# Patient Record
Sex: Male | Born: 1985 | Race: White | Hispanic: No | Marital: Married | Smoking: Never smoker
Health system: Southern US, Community
[De-identification: ages and names within clinical notes are randomized; demographics above are authoritative.]

---

## 2010-08-03 ENCOUNTER — Emergency Department (HOSPITAL_COMMUNITY): Admission: EM | Admit: 2010-08-03 | Discharge: 2010-08-03 | Payer: Self-pay | Admitting: Emergency Medicine

## 2010-12-17 LAB — POCT I-STAT, CHEM 8
Calcium, Ion: 1.14 mmol/L (ref 1.12–1.32)
Chloride: 106 mEq/L (ref 96–112)
Creatinine, Ser: 0.9 mg/dL (ref 0.4–1.5)
Glucose, Bld: 84 mg/dL (ref 70–99)
HCT: 43 % (ref 39.0–52.0)

## 2011-06-18 ENCOUNTER — Other Ambulatory Visit: Payer: Self-pay

## 2011-06-18 ENCOUNTER — Emergency Department (HOSPITAL_BASED_OUTPATIENT_CLINIC_OR_DEPARTMENT_OTHER)
Admission: EM | Admit: 2011-06-18 | Discharge: 2011-06-18 | Disposition: A | Discharge status: Home / Self Care | Payer: PRIVATE HEALTH INSURANCE | Attending: Emergency Medicine | Admitting: Emergency Medicine

## 2011-06-18 ENCOUNTER — Encounter: Payer: Self-pay | Admitting: *Deleted

## 2011-06-18 DIAGNOSIS — J45909 Unspecified asthma, uncomplicated: Secondary | ICD-10-CM | POA: Insufficient documentation

## 2011-06-18 DIAGNOSIS — L299 Pruritus, unspecified: Secondary | ICD-10-CM | POA: Insufficient documentation

## 2011-06-18 DIAGNOSIS — T7840XA Allergy, unspecified, initial encounter: Secondary | ICD-10-CM

## 2011-06-18 MED ORDER — EPINEPHRINE 0.3 MG/0.3ML IJ DEVI: 0.3000 mg | INTRAMUSCULAR | Status: AC | PRN

## 2011-06-18 MED ORDER — EPINEPHRINE HCL 1 MG/ML IJ SOLN
INTRAMUSCULAR | Status: AC
  Administered 2011-06-18: 0.3 mg via INTRAMUSCULAR
  Filled 2011-06-18: qty 1

## 2011-06-18 MED ORDER — DIPHENHYDRAMINE HCL 50 MG/ML IJ SOLN
INTRAMUSCULAR | Status: AC
  Administered 2011-06-18: 50 mg via INTRAVENOUS
  Filled 2011-06-18: qty 1

## 2011-06-18 NOTE — ED Notes (Signed)
Pt receives allergy injections for trees and grass nurse at work administers them has had them numerous times in the past pt had allergic reaction this time within 10 minutes of injection was wheezing and itching feeling as if his throat was going to close nurse gave 25 mg benadryl po called ems they placed iv administered 50 mg of benadryl , 50 mg of zantac iv and .3 of epi IM due to swelling of the throat. Pt awake and alert oriented with no complaints at present states he is feeling better

## 2011-06-18 NOTE — ED Provider Notes (Addendum)
History     CSN: [id number] Arrival date & time: 06/18/2011 12:02 PM   Chief Complaint  Patient presents with  . Allergic Reaction     (Include location/radiation/quality/duration/timing/severity/associated sxs/prior treatment) Patient is a 25 y.o. male presenting with allergic reaction. The history is provided by the patient.  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 His nurse did give 25 mg of Benadryl and called EMS IV was established and was given 50 mg of Benadryl 50 mg Zantac and 0.3 mg of epi IM due to the swelling patient is awake alert and oriented with no complaints at this time and is feeling better. Patient's nurse did come in to check on him she also states that he appears to be much better patient is sleepy from the Benadryl however at this point in time he denies any swelling of the tongue or lips or throat denies any difficulty breathing   Past Medical History  Diagnosis Date  . Asthma      History reviewed. No pertinent past surgical history.  History reviewed. No pertinent family history.  History  Substance Use Topics  . Smoking status: Never Smoker   . Smokeless tobacco: Never Used  . Alcohol Use: Yes     occ      Review of Systems  All other systems reviewed and are negative.    Allergies  Review of patient's allergies indicates no known allergies.  Home Medications   Current Outpatient Rx  Name Route Sig Dispense Refill  . ALBUTEROL SULFATE (2.5 MG/3ML) 0.083% IN NEBU Nebulization Take 2.5 mg by nebulization every 6 (six) hours as needed.        Physical Exam    BP 133/70  Pulse 71  Temp(Src) 97.9 F (36.6 C) (Oral)  Resp 20  SpO2 100%  Physical Exam  Constitutional: He is oriented to person, place, and time. He appears well-developed and well-nourished.  HENT:  Head:  Normocephalic and atraumatic.       Airway is patent. Secretions are normal. No swelling of the, lips or the posterior oral pharynx  Eyes: Conjunctivae and EOM are normal. Pupils are equal, round, and reactive to light.  Neck: Neck supple.  Cardiovascular: Normal rate and regular rhythm.  Exam reveals no gallop and no friction rub.   No murmur heard. Pulmonary/Chest: Breath sounds normal. He has no wheezes. He has no rales. He exhibits no tenderness.  Abdominal: Soft. Bowel sounds are normal. He exhibits no distension. There is no tenderness. There is no rebound and no guarding.  Musculoskeletal: Normal range of motion.  Neurological: He is alert and oriented to person, place, and time. No cranial nerve deficit. Coordination normal.  Skin: Skin is warm and dry. No rash noted.       No sign of any rash or urticaria  Psychiatric: He has a normal mood and affect.    ED Course  Procedures  Results for orders placed during the hospital encounter of 08/03/10  LIPASE, BLOOD      Component Value Range   Lipase 20  11 - 59 (U/L)  POCT I-STAT, CHEM 8      Component Value Range   Sodium 140  135 - 145 (mEq/L)   Potassium 4.1  3.5 - 5.1 (mEq/L)   Chloride 106  96 - 112 (mEq/L)   BUN 17  6 - 23 (  mg/dL)   Creatinine, Ser 0.9  0.4 - 1.5 (mg/dL)   Glucose, Bld 84  70 - 99 (mg/dL)   Calcium, Ion 1.61  0.96 - 1.32 (mmol/L)   TCO2 24  0 - 100 (mmol/L)   Hemoglobin 14.6  13.0 - 17.0 (g/dL)   HCT 04.5  40.9 - 81.1 (%)   No results found.   No diagnosis found.   MDM Pt is seen and examined;  Initial history and physical completed.  Will follow.         Cotey Rakes A. Patrica Duel, MD 06/18/11 1228  Will provide continuous observation, continuous pulse oximetry and cardiac monitoring. Meds had been given prior to arrival with good results  Torres Hardenbrook A. Patrica Duel, MD 06/18/11 1242  Patient has been observed several hours he is feeling much better he's had no recurrence of symptoms he'll be going home  with his sister who will continue to watch him the patient is given prescription for EpiPen and was encouraged to followup with his allergist.  Lorelle Gibbs. Patrica Duel, MD 06/18/11 1400

## 2011-06-18 NOTE — ED Notes (Signed)
Pt sitting up in bed eating graham crackers and peanut butter drinking water and tolerating well sister remains at the bedside vital signs stable and heart rate remains in 60s

## 2011-06-18 NOTE — ED Notes (Signed)
Patient's urine is in room at sink.

## 2012-04-13 ENCOUNTER — Emergency Department (HOSPITAL_COMMUNITY)
Admission: EM | Admit: 2012-04-13 | Discharge: 2012-04-14 | Disposition: A | Discharge status: Home / Self Care | Payer: PRIVATE HEALTH INSURANCE | Attending: Emergency Medicine | Admitting: Emergency Medicine

## 2012-04-13 ENCOUNTER — Encounter (HOSPITAL_COMMUNITY): Payer: Self-pay | Admitting: *Deleted

## 2012-04-13 DIAGNOSIS — J45909 Unspecified asthma, uncomplicated: Secondary | ICD-10-CM | POA: Insufficient documentation

## 2012-04-13 DIAGNOSIS — W219XXA Striking against or struck by unspecified sports equipment, initial encounter: Secondary | ICD-10-CM | POA: Insufficient documentation

## 2012-04-13 DIAGNOSIS — Y9367 Activity, basketball: Secondary | ICD-10-CM | POA: Insufficient documentation

## 2012-04-13 DIAGNOSIS — S0181XA Laceration without foreign body of other part of head, initial encounter: Secondary | ICD-10-CM

## 2012-04-13 DIAGNOSIS — S0180XA Unspecified open wound of other part of head, initial encounter: Secondary | ICD-10-CM | POA: Insufficient documentation

## 2012-04-13 NOTE — ED Notes (Signed)
Pt in c/o laceration under right eye, bleeding controlled

## 2012-04-18 NOTE — ED Provider Notes (Signed)
History     CSN: [id number]  Arrival date & time 04/13/12  2152   First MD Initiated Contact with Patient 04/14/12 0119      Chief Complaint  Patient presents with  . Laceration    (Consider location/radiation/quality/duration/timing/severity/associated sxs/prior treatment) HPI The patient has a laceration under his R eye from getting hit while playing basketball just prior to arrival. The patient denies LOC, blurred vision, or facial pain. The patient states that his tetanus shot is up to date. Past Medical History  Diagnosis Date  . Asthma     History reviewed. No pertinent past surgical history.  History reviewed. No pertinent family history.  History  Substance Use Topics  . Smoking status: Never Smoker   . Smokeless tobacco: Never Used  . Alcohol Use: Yes     occ      Review of Systems All other systems negative except as documented in the HPI. All pertinent positives and negatives as reviewed in the HPI.  Allergies  Review of patient's allergies indicates no known allergies.  Home Medications   Current Outpatient Rx  Name Route Sig Dispense Refill  . ALBUTEROL SULFATE (2.5 MG/3ML) 0.083% IN NEBU Nebulization Take 2.5 mg by nebulization every 6 (six) hours as needed.      Marland Kitchen EPINEPHRINE 0.3 MG/0.3ML IJ DEVI Intramuscular Inject 0.3 mLs (0.3 mg total) into the muscle as needed (Use only as directed). 1 Device 1    BP 128/71  Pulse 68  Resp 20  SpO2 100%  Physical Exam  Constitutional: He appears well-developed and well-nourished.  HENT:  Head:    Eyes: EOM are normal. Pupils are equal, round, and reactive to light.    ED Course  Procedures (including critical care time)  Labs Reviewed - No data to display No results found.   1. Laceration of face    LACERATION REPAIR Performed by: Carlyle Dolly Authorized by: Carlyle Dolly Consent: Verbal consent obtained. Risks and benefits: risks, benefits and alternatives were  discussed Consent given by: patient Patient identity confirmed: provided demographic data Prepped and Draped in normal sterile fashion Wound explored  Laceration Location: R cheek  Laceration Length: 2cm  No Foreign Bodies seen or palpated  Anesthesia: local infiltration  Local anesthetic:none  Anesthetic total: n/a  Irrigation method: syringe Amount of cleaning: standard  Skin closure: Dermabond  Number of sutures:N/A  Technique: Dermabond  Patient tolerance: Patient tolerated the procedure well with no immediate complications.  Wound came together nicely and patient given scar reduction techniques.  MDM          Carlyle Dolly, PA-C 04/18/12 1735

## 2012-04-20 NOTE — ED Provider Notes (Signed)
Medical screening examination/treatment/procedure(s) were performed by non-physician practitioner and as supervising physician I was immediately available for consultation/collaboration.    Celene Kras, MD 04/20/12 [phone]

## 2014-04-12 ENCOUNTER — Ambulatory Visit (HOSPITAL_COMMUNITY)
Admission: RE | Admit: 2014-04-12 | Discharge: 2014-04-12 | Disposition: A | Discharge status: Home / Self Care | Payer: PRIVATE HEALTH INSURANCE | Source: Ambulatory Visit | Attending: Orthopedic Surgery | Admitting: Orthopedic Surgery

## 2014-04-12 ENCOUNTER — Other Ambulatory Visit (HOSPITAL_COMMUNITY): Payer: Self-pay | Admitting: Orthopedic Surgery

## 2014-04-12 DIAGNOSIS — M79609 Pain in unspecified limb: Secondary | ICD-10-CM

## 2014-04-12 DIAGNOSIS — M79604 Pain in right leg: Secondary | ICD-10-CM

## 2014-04-12 DIAGNOSIS — M7989 Other specified soft tissue disorders: Secondary | ICD-10-CM

## 2016-10-13 DIAGNOSIS — Z79899 Other long term (current) drug therapy: Secondary | ICD-10-CM | POA: Diagnosis not present

## 2017-01-14 DIAGNOSIS — Z79899 Other long term (current) drug therapy: Secondary | ICD-10-CM | POA: Diagnosis not present

## 2017-04-14 ENCOUNTER — Other Ambulatory Visit: Payer: Self-pay | Admitting: Family Medicine

## 2017-04-14 ENCOUNTER — Ambulatory Visit
Admission: RE | Admit: 2017-04-14 | Discharge: 2017-04-14 | Disposition: A | Discharge status: Home / Self Care | Payer: Commercial Managed Care - PPO | Source: Ambulatory Visit | Attending: Family Medicine | Admitting: Family Medicine

## 2017-04-14 DIAGNOSIS — M79672 Pain in left foot: Secondary | ICD-10-CM

## 2017-04-14 DIAGNOSIS — S9032XA Contusion of left foot, initial encounter: Secondary | ICD-10-CM | POA: Diagnosis not present

## 2017-04-14 DIAGNOSIS — S99922A Unspecified injury of left foot, initial encounter: Secondary | ICD-10-CM | POA: Diagnosis not present

## 2017-05-11 DIAGNOSIS — Z79899 Other long term (current) drug therapy: Secondary | ICD-10-CM | POA: Diagnosis not present

## 2017-08-10 ENCOUNTER — Other Ambulatory Visit: Payer: Self-pay | Admitting: Family Medicine

## 2017-08-10 DIAGNOSIS — D225 Melanocytic nevi of trunk: Secondary | ICD-10-CM | POA: Diagnosis not present

## 2017-08-10 DIAGNOSIS — N50812 Left testicular pain: Secondary | ICD-10-CM

## 2017-08-19 DIAGNOSIS — D225 Melanocytic nevi of trunk: Secondary | ICD-10-CM | POA: Diagnosis not present

## 2017-08-19 DIAGNOSIS — D1800 Hemangioma unspecified site: Secondary | ICD-10-CM | POA: Diagnosis not present

## 2017-08-19 DIAGNOSIS — L814 Other melanin hyperpigmentation: Secondary | ICD-10-CM | POA: Diagnosis not present

## 2017-08-30 DIAGNOSIS — Z79899 Other long term (current) drug therapy: Secondary | ICD-10-CM | POA: Diagnosis not present

## 2017-12-01 DIAGNOSIS — Z79899 Other long term (current) drug therapy: Secondary | ICD-10-CM | POA: Diagnosis not present

## 2018-01-26 DIAGNOSIS — Z3141 Encounter for fertility testing: Secondary | ICD-10-CM | POA: Diagnosis not present

## 2018-02-10 DIAGNOSIS — Z0000 Encounter for general adult medical examination without abnormal findings: Secondary | ICD-10-CM | POA: Diagnosis not present

## 2018-02-10 DIAGNOSIS — B353 Tinea pedis: Secondary | ICD-10-CM | POA: Diagnosis not present

## 2018-03-18 DIAGNOSIS — Z79899 Other long term (current) drug therapy: Secondary | ICD-10-CM | POA: Diagnosis not present

## 2018-07-04 DIAGNOSIS — Z79899 Other long term (current) drug therapy: Secondary | ICD-10-CM | POA: Diagnosis not present

## 2018-08-31 DIAGNOSIS — Z23 Encounter for immunization: Secondary | ICD-10-CM | POA: Diagnosis not present

## 2018-09-15 IMAGING — CR DG FOOT COMPLETE 3+V*L*
3 series · 3 of 3 positions shown · non-contrast
Comparison: None.

CLINICAL DATA: Soccer injury to LEFT forefoot 1 week ago / hematoma
across cuboid and navicular bones / concern for FX / jdh 315

EXAM:
LEFT FOOT - COMPLETE 3+ VIEW

[x foot ap left]
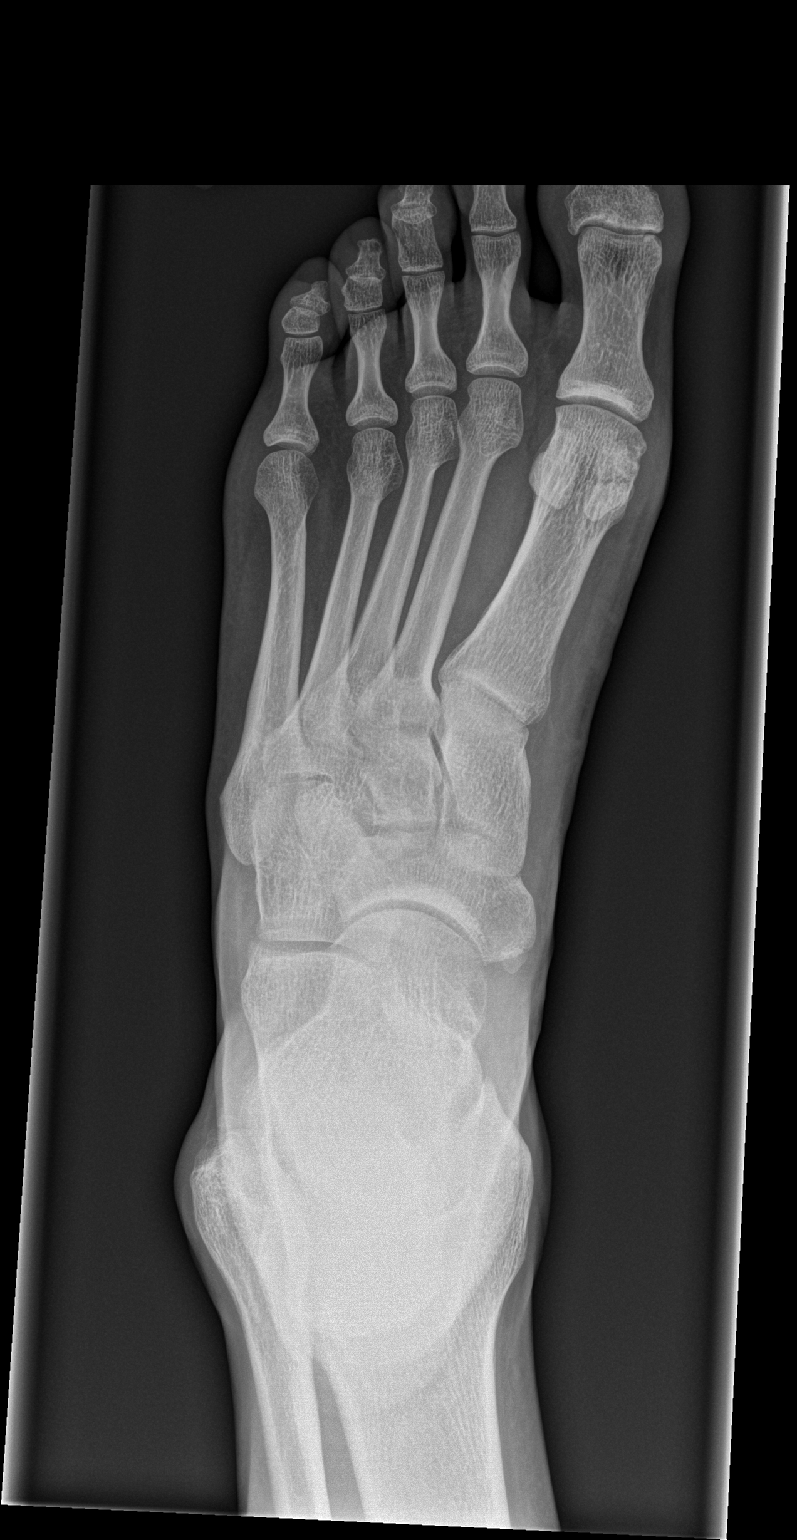

[x foot obl left]
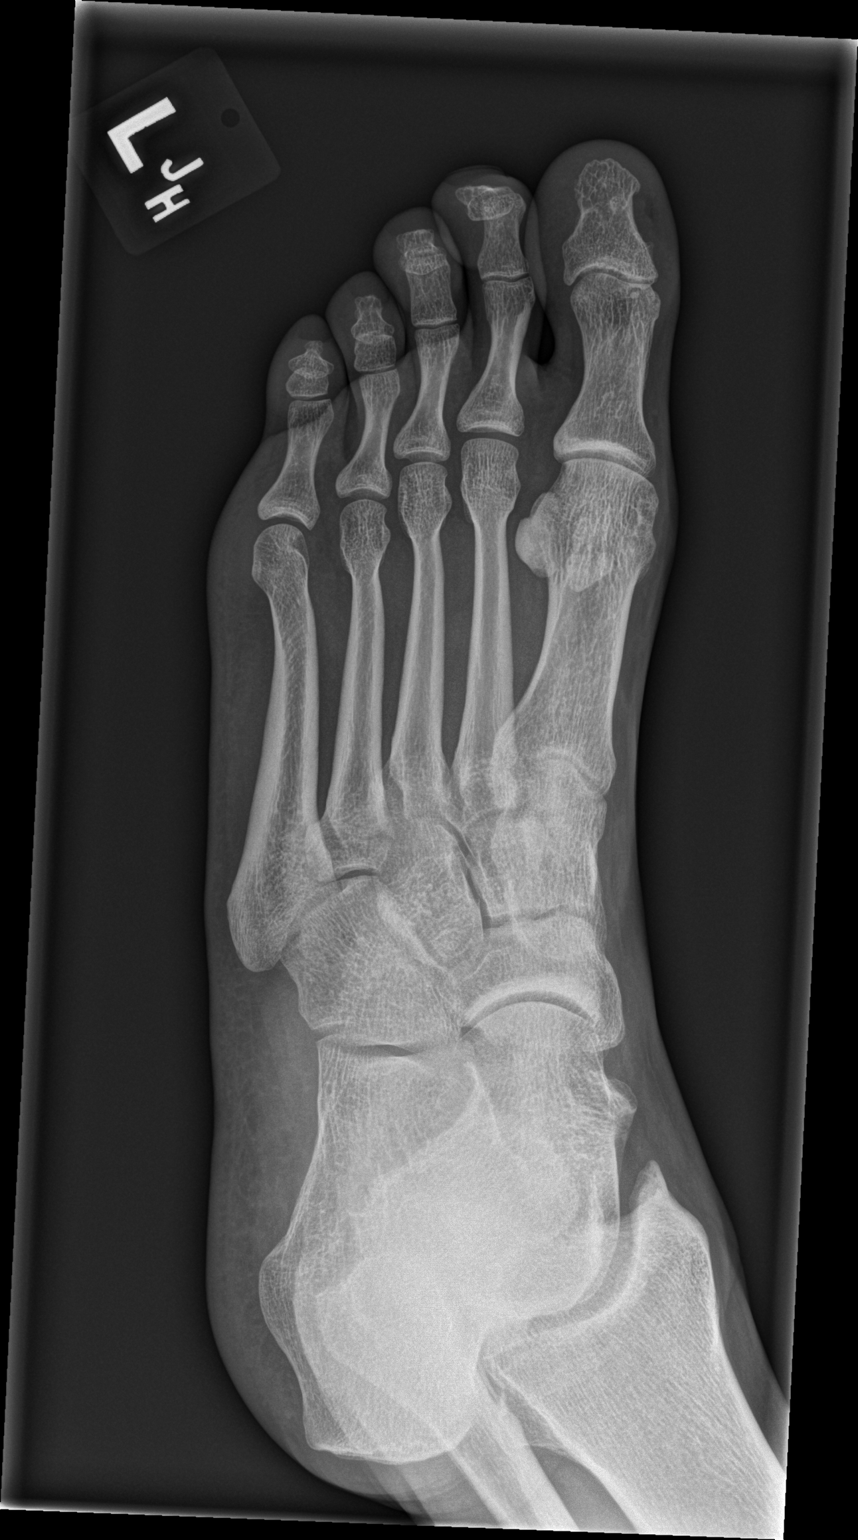

[x foot lat left]
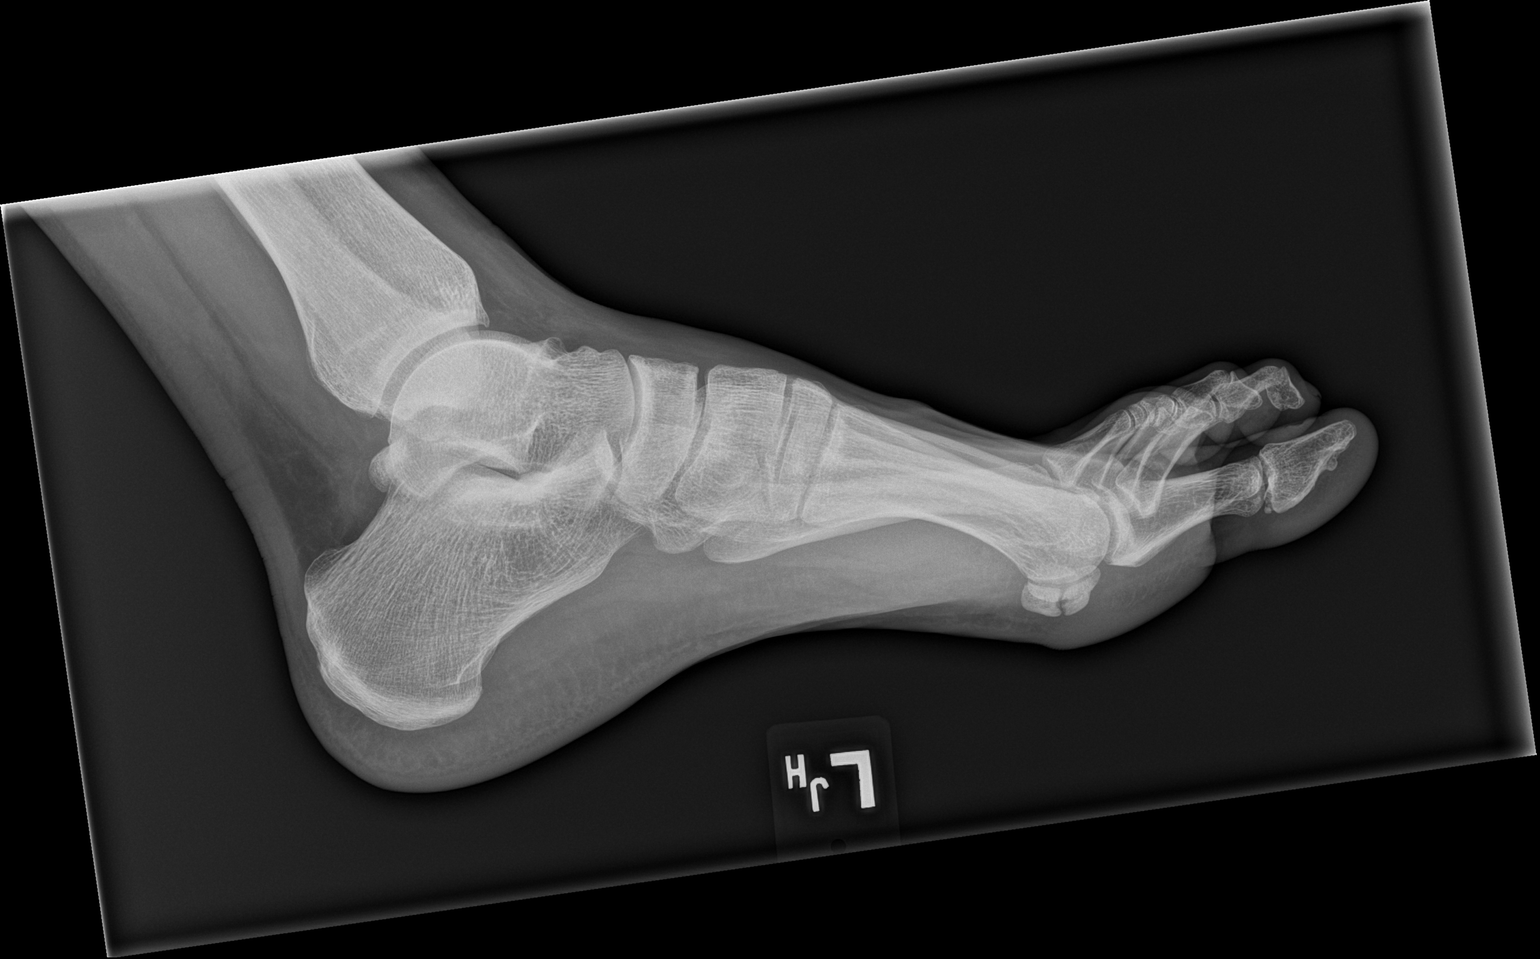

[3 of 3 positions shown; findings below may reference images not displayed]

FINDINGS: There is no evidence of fracture or dislocation. There is no
evidence of arthropathy or other focal bone abnormality. Soft
tissues are unremarkable.
IMPRESSION: Negative.

## 2018-10-19 DIAGNOSIS — Z79899 Other long term (current) drug therapy: Secondary | ICD-10-CM | POA: Diagnosis not present

## 2019-10-16 ENCOUNTER — Ambulatory Visit: Payer: Commercial Managed Care - PPO | Admitting: Orthopedic Surgery

## 2023-06-03 ENCOUNTER — Ambulatory Visit: Payer: Commercial Managed Care - PPO | Admitting: Orthopedic Surgery
